# Patient Record
Sex: Male | Born: 1985
Health system: Southern US, Community
[De-identification: ages and names within clinical notes are randomized; demographics above are authoritative.]

## PROBLEM LIST (undated history)

## (undated) DIAGNOSIS — E039 Hypothyroidism, unspecified: Secondary | ICD-10-CM

## (undated) DIAGNOSIS — F419 Anxiety disorder, unspecified: Secondary | ICD-10-CM

## (undated) DIAGNOSIS — I10 Essential (primary) hypertension: Secondary | ICD-10-CM

## (undated) HISTORY — DX: Anxiety disorder, unspecified: F41.9

## (undated) HISTORY — DX: Hypothyroidism, unspecified: E03.9

## (undated) HISTORY — DX: Essential (primary) hypertension: I10

---

## 2019-12-10 DIAGNOSIS — Z20822 Contact with and (suspected) exposure to covid-19: Secondary | ICD-10-CM | POA: Diagnosis not present

## 2019-12-20 DIAGNOSIS — R030 Elevated blood-pressure reading, without diagnosis of hypertension: Secondary | ICD-10-CM | POA: Diagnosis not present

## 2019-12-20 DIAGNOSIS — F419 Anxiety disorder, unspecified: Secondary | ICD-10-CM | POA: Diagnosis not present

## 2019-12-31 ENCOUNTER — Ambulatory Visit (INDEPENDENT_AMBULATORY_CARE_PROVIDER_SITE_OTHER): Payer: BC Managed Care – PPO | Admitting: Family Medicine

## 2019-12-31 ENCOUNTER — Encounter: Payer: Self-pay | Admitting: Family Medicine

## 2019-12-31 ENCOUNTER — Other Ambulatory Visit: Payer: Self-pay

## 2019-12-31 VITALS — BP 134/84 | HR 69 | Temp 98.5°F | Ht 75.0 in | Wt 226.1 lb

## 2019-12-31 DIAGNOSIS — R002 Palpitations: Secondary | ICD-10-CM

## 2019-12-31 DIAGNOSIS — Z8616 Personal history of COVID-19: Secondary | ICD-10-CM | POA: Diagnosis not present

## 2019-12-31 DIAGNOSIS — Z23 Encounter for immunization: Secondary | ICD-10-CM

## 2019-12-31 DIAGNOSIS — R079 Chest pain, unspecified: Secondary | ICD-10-CM | POA: Diagnosis not present

## 2019-12-31 DIAGNOSIS — F411 Generalized anxiety disorder: Secondary | ICD-10-CM

## 2019-12-31 MED ORDER — ALPRAZOLAM 0.5 MG PO TABS: 0.5000 mg | ORAL_TABLET | Freq: Two times a day (BID) | ORAL | 1 refills | Status: DC | PRN

## 2019-12-31 NOTE — Patient Instructions (Signed)
Nice to meet you today! Have labs completed, we'll be in touch with results.  If symptoms persist we may need to do a monitor to check for abnormal heart rhythms.

## 2020-01-01 ENCOUNTER — Other Ambulatory Visit: Payer: Self-pay | Admitting: Family Medicine

## 2020-01-01 LAB — D-DIMER, QUANTITATIVE: D-Dimer, Quant: 0.24 mcg/mL FEU (ref <0.50)

## 2020-01-01 MED ORDER — BUSPIRONE HCL 10 MG PO TABS: ORAL_TABLET | ORAL | 3 refills | Status: DC

## 2020-01-02 DIAGNOSIS — R002 Palpitations: Secondary | ICD-10-CM | POA: Insufficient documentation

## 2020-01-02 DIAGNOSIS — F411 Generalized anxiety disorder: Secondary | ICD-10-CM | POA: Insufficient documentation

## 2020-01-02 NOTE — Assessment & Plan Note (Signed)
EKG with NSR Check D-dimer with recent covid and sharp chest pain.  Discussed that palpitations/tachycardia may occur after having COVID, usually improve over time. Reminded to stay well hydrated.  May also be related to anxiety as he did have some palpitations prior to COVID Can consider holter monitor/ECHO is symptoms persist or worsen.

## 2020-01-02 NOTE — Progress Notes (Signed)
Dylan Owens - 34 y.o. male MRN [id number]  Date of birth: 1985-09-20  Subjective Chief Complaint  Patient presents with  . Establish Care    HPI Dylan Owens is a 34 y.o. male here today for initial visit.  He has history of anxiety.   He was diagnosed with COVID last month.  Course was fairly uncomplicated however he has had recurrent episodes of palpitations since that time.  Had some palpitations related to anxiety previously but increased frequency after COVID. He has also had some sharp pain located along the L side of the chest.  He denies chest tightness or radiation of pain. He does not feel shortness of breath.  He feels like anxiety if pretty well controlled and he takes buspar daily with alprazolam as needed.    ROS:  A comprehensive ROS was completed and negative except as noted per HPI  No Known Allergies  Past Medical History:  Diagnosis Date  . Anxiety   . HTN (hypertension)   . Hypothyroidism     History reviewed. No pertinent surgical history.  Social History   Socioeconomic History  . Marital status: Married    Spouse name: Not on file  . Number of children: Not on file  . Years of education: Not on file  . Highest education level: Not on file  Occupational History  . Not on file  Tobacco Use  . Smoking status: Former Smoker    Types: Cigarettes    Quit date: 2020    Years since quitting: 1.6  . Smokeless tobacco: Former Network engineer  . Vaping Use: Never used  Substance and Sexual Activity  . Alcohol use: Yes    Alcohol/week: 2.0 standard drinks    Types: 2 Standard drinks or equivalent per week  . Drug use: Not Currently    Types: Marijuana  . Sexual activity: Yes    Partners: Female    Birth control/protection: None  Other Topics Concern  . Not on file  Social History Narrative  . Not on file   Social Determinants of Health   Financial Resource Strain:   . Difficulty of Paying Living Expenses:   Food Insecurity:   . Worried  About Charity fundraiser in the Last Year:   . Arboriculturist in the Last Year:   Transportation Needs:   . Film/video editor (Medical):   Marland Kitchen Lack of Transportation (Non-Medical):   Physical Activity:   . Days of Exercise per Week:   . Minutes of Exercise per Session:   Stress:   . Feeling of Stress :   Social Connections:   . Frequency of Communication with Friends and Family:   . Frequency of Social Gatherings with Friends and Family:   . Attends Religious Services:   . Active Member of Clubs or Organizations:   . Attends Archivist Meetings:   Marland Kitchen Marital Status:     Family History  Problem Relation Age of Onset  . High blood pressure Mother   . Hypothyroidism Mother   . High blood pressure Father     Health Maintenance  Topic Date Due  . Hepatitis C Screening  Never done  . COVID-19 Vaccine (1) Never done  . HIV Screening  Never done  . INFLUENZA VACCINE  12/16/2019  . TETANUS/TDAP  12/30/2029     ----------------------------------------------------------------------------------------------------------------------------------------------------------------------------------------------------------------- Physical Exam BP 134/84 (BP Location: Left Arm, Patient Position: Sitting, Cuff Size: Normal)   Pulse 69  Temp 98.5 F (36.9 C) (Oral)   Ht 6\' 3"  (1.905 m)   Wt 226 lb 1.9 oz (102.6 kg)   BMI 28.26 kg/m   Physical Exam Constitutional:      Appearance: Normal appearance.  Eyes:     General: No scleral icterus. Cardiovascular:     Rate and Rhythm: Normal rate and regular rhythm.  Pulmonary:     Effort: Pulmonary effort is normal.     Breath sounds: Normal breath sounds.  Musculoskeletal:     Cervical back: Neck supple.  Neurological:     General: No focal deficit present.     Mental Status: He is alert.  Psychiatric:        Mood and Affect: Mood normal.        Behavior: Behavior normal.      ------------------------------------------------------------------------------------------------------------------------------------------------------------------------------------------------------------------- Assessment and Plan  Palpitations EKG with NSR Check D-dimer with recent covid and sharp chest pain.  Discussed that palpitations/tachycardia may occur after having COVID, usually improve over time. Reminded to stay well hydrated.  May also be related to anxiety as he did have some palpitations prior to COVID Can consider holter monitor/ECHO is symptoms persist or worsen.     GAD (generalized anxiety disorder) He will continue buspar daily with alprazolam as needed.    Meds ordered this encounter  Medications  . ALPRAZolam (XANAX) 0.5 MG tablet    Sig: Take 1 tablet (0.5 mg total) by mouth 2 (two) times daily as needed for anxiety.    Dispense:  30 tablet    Refill:  1    No follow-ups on file.    This visit occurred during the SARS-CoV-2 public health emergency.  Safety protocols were in place, including screening questions prior to the visit, additional usage of staff PPE, and extensive cleaning of exam room while observing appropriate contact time as indicated for disinfecting solutions.

## 2020-01-02 NOTE — Assessment & Plan Note (Signed)
He will continue buspar daily with alprazolam as needed.

## 2020-01-05 DIAGNOSIS — R030 Elevated blood-pressure reading, without diagnosis of hypertension: Secondary | ICD-10-CM | POA: Diagnosis not present

## 2020-01-05 DIAGNOSIS — F411 Generalized anxiety disorder: Secondary | ICD-10-CM | POA: Diagnosis not present

## 2020-01-08 ENCOUNTER — Ambulatory Visit: Payer: Self-pay | Attending: Internal Medicine

## 2020-01-08 ENCOUNTER — Ambulatory Visit: Payer: Self-pay

## 2020-01-08 DIAGNOSIS — Z23 Encounter for immunization: Secondary | ICD-10-CM

## 2020-01-08 NOTE — Progress Notes (Signed)
   Covid-19 Vaccination Clinic  Name:  Dylan Owens    MRN: [id number] DOB: [birth year]  01/08/2020  [name] was observed post Covid-19 immunization for 15 minutes without incident. He was provided with Vaccine Information Sheet and instruction to access the V-Safe system.   [name] was instructed to call 911 with any severe reactions post vaccine: Marland Kitchen Difficulty breathing  . Swelling of face and throat  . A fast heartbeat  . A bad rash all over body  . Dizziness and weakness   Immunizations Administered    Name Date Dose VIS Date Route   Pfizer COVID-19 Vaccine 01/08/2020  1:59 PM 0.3 mL 07/11/2018 Intramuscular   Manufacturer: [place]   Lot: [id number]   [place]: [id number]

## 2020-01-20 ENCOUNTER — Encounter: Payer: Self-pay | Admitting: Family Medicine

## 2020-01-22 ENCOUNTER — Telehealth: Payer: Self-pay | Admitting: Family Medicine

## 2020-01-22 NOTE — Telephone Encounter (Signed)
Error, taken care of.

## 2020-01-22 NOTE — Telephone Encounter (Signed)
I dont see documentation of elbow issue.   If sports med related issue, see Dr Darene Lamer

## 2020-01-22 NOTE — Telephone Encounter (Signed)
Patient was seen on 08/02/19 for this, also per mother.

## 2020-01-22 NOTE — Telephone Encounter (Signed)
Mother wanted to know if the issue for his elbow should be addressed with PCP or Dr.T. States that its his inner elbow now. Please advise.

## 2020-01-24 ENCOUNTER — Ambulatory Visit (INDEPENDENT_AMBULATORY_CARE_PROVIDER_SITE_OTHER): Payer: BC Managed Care – PPO | Admitting: Family Medicine

## 2020-01-24 ENCOUNTER — Encounter: Payer: Self-pay | Admitting: Family Medicine

## 2020-01-24 DIAGNOSIS — F411 Generalized anxiety disorder: Secondary | ICD-10-CM

## 2020-01-24 MED ORDER — BUSPIRONE HCL 15 MG PO TABS: ORAL_TABLET | ORAL | 1 refills | Status: DC

## 2020-01-24 NOTE — Patient Instructions (Signed)
Great to see you! Increase buspar to 15mg  twice per day. You may increased to three times per day if needed.  See me again in in about 4-6 weeks.

## 2020-01-24 NOTE — Assessment & Plan Note (Signed)
Increasing buspar to 15mg  BID He will continue alprazolam as needed.  Return in about 4 weeks (around 02/21/2020) for Anxiety.

## 2020-01-24 NOTE — Progress Notes (Signed)
Dylan Owens - 34 y.o. male MRN [id number]  Date of birth: Dec 08, 1985  Subjective No chief complaint on file.   HPI Dylan Owens is a 34 y.o. male here today for follow up of anxiety.   He has been taking buspar 10mg  BID with alprazolam as needed. He continues to have episodes of chest tightness.  Some improvement with buspar but feels like he needs to increase dose.  No side effects noted at current dose.    Depression screen [facility] 2/9 01/24/2020 12/31/2019  Decreased Interest 0 0  Down, Depressed, Hopeless 0 0  PHQ - 2 Score 0 0  Altered sleeping - 3  Tired, decreased energy - 2  Change in appetite - 2  Feeling bad or failure about yourself  - 0  Trouble concentrating - 1  Moving slowly or fidgety/restless - 0  Suicidal thoughts - 0  PHQ-9 Score - 8  Difficult doing work/chores - Somewhat difficult   GAD 7 : Generalized Anxiety Score 01/24/2020 12/31/2019  Nervous, Anxious, on Edge 1 2  Control/stop worrying 2 2  Worry too much - different things 2 2  Trouble relaxing 2 2  Restless 0 0  Easily annoyed or irritable 2 2  Afraid - awful might happen 1 2  Total GAD 7 Score 10 12  Anxiety Difficulty Somewhat difficult Somewhat difficult   ROS:  A comprehensive ROS was completed and negative except as noted per HPI   No Known Allergies  Past Medical History:  Diagnosis Date  . Anxiety   . HTN (hypertension)   . Hypothyroidism     No past surgical history on file.  Social History   Socioeconomic History  . Marital status: Married    Spouse name: Not on file  . Number of children: Not on file  . Years of education: Not on file  . Highest education level: Not on file  Occupational History  . Not on file  Tobacco Use  . Smoking status: Former Smoker    Types: Cigarettes    Quit date: 2020    Years since quitting: 1.6  . Smokeless tobacco: Former Network engineer  . Vaping Use: Never used  Substance and Sexual Activity  . Alcohol use: Yes    Alcohol/week: 2.0  standard drinks    Types: 2 Standard drinks or equivalent per week  . Drug use: Not Currently    Types: Marijuana  . Sexual activity: Yes    Partners: Female    Birth control/protection: None  Other Topics Concern  . Not on file  Social History Narrative  . Not on file   Social Determinants of Health   Financial Resource Strain:   . Difficulty of Paying Living Expenses: Not on file  Food Insecurity:   . Worried About Charity fundraiser in the Last Year: Not on file  . Ran Out of Food in the Last Year: Not on file  Transportation Needs:   . Lack of Transportation (Medical): Not on file  . Lack of Transportation (Non-Medical): Not on file  Physical Activity:   . Days of Exercise per Week: Not on file  . Minutes of Exercise per Session: Not on file  Stress:   . Feeling of Stress : Not on file  Social Connections:   . Frequency of Communication with Friends and Family: Not on file  . Frequency of Social Gatherings with Friends and Family: Not on file  . Attends Religious Services: Not on file  .  Active Member of Clubs or Organizations: Not on file  . Attends Archivist Meetings: Not on file  . Marital Status: Not on file    Family History  Problem Relation Age of Onset  . High blood pressure Mother   . Hypothyroidism Mother   . High blood pressure Father     Health Maintenance  Topic Date Due  . Hepatitis C Screening  Never done  . HIV Screening  Never done  . INFLUENZA VACCINE  Never done  . COVID-19 Vaccine (2 - Pfizer 2-dose series) 01/29/2020  . TETANUS/TDAP  12/30/2029     ----------------------------------------------------------------------------------------------------------------------------------------------------------------------------------------------------------------- Physical Exam BP 120/89 (BP Location: Left Arm, Patient Position: Sitting, Cuff Size: Normal)   Pulse 65   Temp 98.1 F (36.7 C)   Ht 6\' 3"  (1.905 m)   Wt 226 lb 4.8 oz  (102.6 kg)   SpO2 100%   BMI 28.29 kg/m   Physical Exam Constitutional:      Appearance: Normal appearance.  Neurological:     General: No focal deficit present.     Mental Status: He is alert.  Psychiatric:        Mood and Affect: Mood normal.        Behavior: Behavior normal.     ------------------------------------------------------------------------------------------------------------------------------------------------------------------------------------------------------------------- Assessment and Plan  GAD (generalized anxiety disorder) Increasing buspar to 15mg  BID He will continue alprazolam as needed.  Return in about 4 weeks (around 02/21/2020) for Anxiety.    Meds ordered this encounter  Medications  . busPIRone (BUSPAR) 15 MG tablet    Sig: Take 1 tab BID.  May increase to TID if needed.    Dispense:  60 tablet    Refill:  1    No follow-ups on file.    This visit occurred during the SARS-CoV-2 public health emergency.  Safety protocols were in place, including screening questions prior to the visit, additional usage of staff PPE, and extensive cleaning of exam room while observing appropriate contact time as indicated for disinfecting solutions.

## 2020-02-05 ENCOUNTER — Ambulatory Visit: Payer: BC Managed Care – PPO | Attending: Internal Medicine

## 2020-02-05 ENCOUNTER — Encounter: Payer: Self-pay | Admitting: Family Medicine

## 2020-02-05 ENCOUNTER — Ambulatory Visit: Payer: BC Managed Care – PPO

## 2020-02-05 DIAGNOSIS — Z23 Encounter for immunization: Secondary | ICD-10-CM

## 2020-02-05 NOTE — Progress Notes (Signed)
   Covid-19 Vaccination Clinic  Name:  Dylan Owens    MRN: [id number] DOB: Jan 04, 1986  02/05/2020  Mr. [name] was observed post Covid-19 immunization for 15 minutes without incident. He was provided with Vaccine Information Sheet and instruction to access the V-Safe system.   Mr. [name] was instructed to call 911 with any severe reactions post vaccine: Marland Kitchen Difficulty breathing  . Swelling of face and throat  . A fast heartbeat  . A bad rash all over body  . Dizziness and weakness   Immunizations Administered    Name Date Dose VIS Date Route   Pfizer COVID-19 Vaccine 02/05/2020 10:38 AM 0.3 mL 07/11/2018 Intramuscular   Manufacturer: [place]   Lot: [id number]   [place]: [id number]

## 2020-02-21 ENCOUNTER — Encounter: Payer: Self-pay | Admitting: Family Medicine

## 2020-02-21 ENCOUNTER — Ambulatory Visit (INDEPENDENT_AMBULATORY_CARE_PROVIDER_SITE_OTHER): Payer: BC Managed Care – PPO | Admitting: Family Medicine

## 2020-02-21 DIAGNOSIS — F411 Generalized anxiety disorder: Secondary | ICD-10-CM | POA: Diagnosis not present

## 2020-02-21 DIAGNOSIS — R079 Chest pain, unspecified: Secondary | ICD-10-CM

## 2020-02-21 NOTE — Assessment & Plan Note (Signed)
Chest pain is atypical and I still favor anxiety as the cause of his symptoms.  If this persists will plan to obtain CXR.

## 2020-02-21 NOTE — Assessment & Plan Note (Signed)
Improved with current dose of buspar and alprazolam as needed.  Will continue at current strength and schedule for now.

## 2020-02-21 NOTE — Progress Notes (Signed)
Dylan Owens - 34 y.o. male MRN [id number]  Date of birth: 10-26-1985  Subjective No chief complaint on file.   HPI Dylan Owens is a 34 y.o. male here today for follow up of anxiety.  Anxiety is currently managed with buspar daily and alprazolam as needed.  He did go ahead and increase his buspar to 15mg  TID and feels that this is working much better.  He is using alprazolam occasionally.    He does continue to have episodic chest pain.  Previous EKG normal.  Pain varies sometimes sharp and sometimes dull.  He had some mild improvement with PPI.  Denies dyspnea, nausea or vomiting.    Depression screen [facility] 2/9 02/21/2020 01/24/2020 12/31/2019  Decreased Interest 1 0 0  Down, Depressed, Hopeless 1 0 0  PHQ - 2 Score 2 0 0  Altered sleeping 2 - 3  Tired, decreased energy 1 - 2  Change in appetite 0 - 2  Feeling bad or failure about yourself  0 - 0  Trouble concentrating 0 - 1  Moving slowly or fidgety/restless 0 - 0  Suicidal thoughts 0 - 0  PHQ-9 Score 5 - 8  Difficult doing work/chores Not difficult at all - Somewhat difficult   GAD 7 : Generalized Anxiety Score 02/21/2020 01/24/2020 12/31/2019  Nervous, Anxious, on Edge 2 1 2   Control/stop worrying 1 2 2   Worry too much - different things 1 2 2   Trouble relaxing 1 2 2   Restless 0 0 0  Easily annoyed or irritable 1 2 2   Afraid - awful might happen 1 1 2   Total GAD 7 Score 7 10 12   Anxiety Difficulty Somewhat difficult Somewhat difficult Somewhat difficult      ROS:  A comprehensive ROS was completed and negative except as noted per HPI  No Known Allergies  Past Medical History:  Diagnosis Date  . Anxiety   . HTN (hypertension)   . Hypothyroidism     No past surgical history on file.  Social History   Socioeconomic History  . Marital status: Married    Spouse name: Not on file  . Number of children: Not on file  . Years of education: Not on file  . Highest education level: Not on file  Occupational History  .  Not on file  Tobacco Use  . Smoking status: Former Smoker    Types: Cigarettes    Quit date: 2020    Years since quitting: 1.7  . Smokeless tobacco: Former Network engineer  . Vaping Use: Never used  Substance and Sexual Activity  . Alcohol use: Yes    Alcohol/week: 2.0 standard drinks    Types: 2 Standard drinks or equivalent per week  . Drug use: Not Currently    Types: Marijuana  . Sexual activity: Yes    Partners: Female    Birth control/protection: None  Other Topics Concern  . Not on file  Social History Narrative  . Not on file   Social Determinants of Health   Financial Resource Strain:   . Difficulty of Paying Living Expenses: Not on file  Food Insecurity:   . Worried About Charity fundraiser in the Last Year: Not on file  . Ran Out of Food in the Last Year: Not on file  Transportation Needs:   . Lack of Transportation (Medical): Not on file  . Lack of Transportation (Non-Medical): Not on file  Physical Activity:   . Days of Exercise per Week:  Not on file  . Minutes of Exercise per Session: Not on file  Stress:   . Feeling of Stress : Not on file  Social Connections:   . Frequency of Communication with Friends and Family: Not on file  . Frequency of Social Gatherings with Friends and Family: Not on file  . Attends Religious Services: Not on file  . Active Member of Clubs or Organizations: Not on file  . Attends Archivist Meetings: Not on file  . Marital Status: Not on file    Family History  Problem Relation Age of Onset  . High blood pressure Mother   . Hypothyroidism Mother   . High blood pressure Father     Health Maintenance  Topic Date Due  . Hepatitis C Screening  Never done  . HIV Screening  Never done  . INFLUENZA VACCINE  Never done  . TETANUS/TDAP  12/30/2029  . COVID-19 Vaccine  Completed      ----------------------------------------------------------------------------------------------------------------------------------------------------------------------------------------------------------------- Physical Exam BP 127/83 (BP Location: Left Arm, Patient Position: Sitting, Cuff Size: Large)   Pulse 100   Temp 97.9 F (36.6 C)   Wt 234 lb 6.4 oz (106.3 kg)   SpO2 100%   BMI 29.30 kg/m   Physical Exam Constitutional:      Appearance: Normal appearance.  Cardiovascular:     Rate and Rhythm: Normal rate and regular rhythm.  Pulmonary:     Effort: Pulmonary effort is normal.     Breath sounds: Normal breath sounds.  Musculoskeletal:     Cervical back: Neck supple.  Neurological:     Mental Status: He is alert.     ------------------------------------------------------------------------------------------------------------------------------------------------------------------------------------------------------------------- Assessment and Plan  Chest pain Chest pain is atypical and I still favor anxiety as the cause of his symptoms.  If this persists will plan to obtain CXR.   GAD (generalized anxiety disorder) Improved with current dose of buspar and alprazolam as needed.  Will continue at current strength and schedule for now.     No orders of the defined types were placed in this encounter.   No follow-ups on file.    This visit occurred during the SARS-CoV-2 public health emergency.  Safety protocols were in place, including screening questions prior to the visit, additional usage of staff PPE, and extensive cleaning of exam room while observing appropriate contact time as indicated for disinfecting solutions.

## 2020-03-13 ENCOUNTER — Encounter: Payer: Self-pay | Admitting: Family Medicine

## 2020-03-14 ENCOUNTER — Other Ambulatory Visit: Payer: Self-pay | Admitting: Family Medicine

## 2020-03-14 DIAGNOSIS — R0789 Other chest pain: Secondary | ICD-10-CM

## 2020-03-18 ENCOUNTER — Encounter: Payer: Self-pay | Admitting: Family Medicine

## 2020-03-19 ENCOUNTER — Other Ambulatory Visit: Payer: Self-pay

## 2020-03-19 ENCOUNTER — Ambulatory Visit (INDEPENDENT_AMBULATORY_CARE_PROVIDER_SITE_OTHER): Payer: BC Managed Care – PPO

## 2020-03-19 DIAGNOSIS — R079 Chest pain, unspecified: Secondary | ICD-10-CM | POA: Diagnosis not present

## 2020-03-19 DIAGNOSIS — R0789 Other chest pain: Secondary | ICD-10-CM | POA: Diagnosis not present

## 2020-03-20 ENCOUNTER — Encounter: Payer: Self-pay | Admitting: Family Medicine

## 2020-03-20 ENCOUNTER — Ambulatory Visit (INDEPENDENT_AMBULATORY_CARE_PROVIDER_SITE_OTHER): Payer: BC Managed Care – PPO | Admitting: Family Medicine

## 2020-03-20 VITALS — BP 148/92 | HR 70 | Temp 97.9°F | Wt 240.1 lb

## 2020-03-20 DIAGNOSIS — K602 Anal fissure, unspecified: Secondary | ICD-10-CM | POA: Diagnosis not present

## 2020-03-20 DIAGNOSIS — Z23 Encounter for immunization: Secondary | ICD-10-CM

## 2020-03-20 DIAGNOSIS — F410 Panic disorder [episodic paroxysmal anxiety] without agoraphobia: Secondary | ICD-10-CM | POA: Diagnosis not present

## 2020-03-20 MED ORDER — NIFEDIPINE POWD: 1 refills | Status: DC

## 2020-03-20 NOTE — Patient Instructions (Signed)
Increase fiber and fluid intake (benefiber, metamucil)     Anal Fissure, Adult  An anal fissure is a small tear or crack in the tissue of the anus. Bleeding from a fissure usually stops on its own within a few minutes. However, bleeding will often occur again with each bowel movement until the fissure heals. What are the causes? This condition is usually caused by passing a large or hard stool (feces). Other causes include:  Constipation.  Frequent diarrhea.  Inflammatory bowel disease (Crohn's disease or ulcerative colitis).  Childbirth.  Infections.  Anal sex. What are the signs or symptoms? Symptoms of this condition include:  Bleeding from the rectum.  Small amounts of blood seen on your stool, on the toilet paper, or in the toilet after a bowel movement. The blood coats the outside of the stool and is not mixed with the stool.  Painful bowel movements.  Itching or irritation around the anus. How is this diagnosed? A health care provider may diagnose this condition by closely examining the anal area. An anal fissure can usually be seen with careful inspection. In some cases, a rectal exam may be performed, or a short tube (anoscope) may be used to examine the anal canal. How is this treated? Initial treatment for this condition may include:  Taking steps to avoid constipation. This may include making changes to your diet, such as increasing your intake of fiber or fluid.  Taking fiber supplements. These supplements can soften your stool to help make bowel movements easier. Your health care provider may also prescribe a stool softener if your stool is hard.  Taking sitz baths. This may help to heal the tear.  Using medicated creams or ointments. These may be prescribed to lessen discomfort. Treatments that are sometimes used if initial treatments do not work well or if the condition is more severe may include:  Botulinum injection.  Surgery to repair the  fissure. Follow these instructions at home: Eating and drinking   Avoid foods that may cause constipation, such as bananas, milk, and other dairy products.  Eat all fruits, except bananas.  Drink enough fluid to keep your urine pale yellow.  Eat foods that are high in fiber, such as beans, whole grains, and fresh fruits and vegetables. General instructions   Take over-the-counter and prescription medicines only as told by your health care provider.  Use creams or ointments only as told by your health care provider.  Keep the anal area clean and dry.  Take sitz baths as told by your health care provider. Do not use soap in the sitz baths.  Keep all follow-up visits as told by your health care provider. This is important. Contact a health care provider if you have:  More bleeding.  A fever.  Diarrhea that is mixed with blood.  Pain that continues.  Ongoing problems that are getting worse rather than better. Summary  An anal fissure is a small tear or crack in the tissue of the anus. This condition is usually caused by passing a large or hard stool (feces). Other causes include constipation and frequent diarrhea.  Initial treatment for this condition may include taking steps to avoid constipation, such as increasing your intake of fiber or fluid.  Follow instructions for care as told by your health care provider.  Contact your health care provider if you have more bleeding or your problem is getting worse rather than better.  Keep all follow-up visits as told by your health care provider. This  is important. This information is not intended to replace advice given to you by your health care provider. Make sure you discuss any questions you have with your health care provider. Document Revised: 10/13/2017 Document Reviewed: 10/13/2017 Elsevier Patient Education  [place].

## 2020-03-20 NOTE — Assessment & Plan Note (Addendum)
Anal fissure noted.  Recommend increased fiber intake or utilization of stool softener.  Also recommend increased fluid intake.  Will have 0.3% nifedipine gel compounded to apply rectally as needed for comfort and facilitate healing.  Instructed to follow up if having increased bleeding or if not improving.

## 2020-03-20 NOTE — Progress Notes (Signed)
[name] - 34 y.o. male MRN [id number]  Date of birth: Jun 16, 1985  Subjective Chief Complaint  Patient presents with  . Rectal Bleeding    HPI Dylan Owens is a 34 y.o. male here today with complaint of rectal pain and bleeding.  This started a few days ago.  He has blood when wiping as well as some blood on the stool at times.    Pain is sharp, burning sensation with bowel movements.  He has history of hard bowel movements. He has not tried anything for treatment so far.  He admits that his fiber intake is pretty low.    ROS:  A comprehensive ROS was completed and negative except as noted per HPI  No Known Allergies  Past Medical History:  Diagnosis Date  . Anxiety   . HTN (hypertension)   . Hypothyroidism     History reviewed. No pertinent surgical history.  Social History   Socioeconomic History  . Marital status: Married    Spouse name: Not on file  . Number of children: Not on file  . Years of education: Not on file  . Highest education level: Not on file  Occupational History  . Not on file  Tobacco Use  . Smoking status: Former Smoker    Types: Cigarettes    Quit date: 2020    Years since quitting: 1.8  . Smokeless tobacco: Former Network engineer  . Vaping Use: Never used  Substance and Sexual Activity  . Alcohol use: Yes    Alcohol/week: 2.0 standard drinks    Types: 2 Standard drinks or equivalent per week  . Drug use: Not Currently    Types: Marijuana  . Sexual activity: Yes    Partners: Female    Birth control/protection: None  Other Topics Concern  . Not on file  Social History Narrative  . Not on file   Social Determinants of Health   Financial Resource Strain:   . Difficulty of Paying Living Expenses: Not on file  Food Insecurity:   . Worried About Charity fundraiser in the Last Year: Not on file  . Ran Out of Food in the Last Year: Not on file  Transportation Needs:   . Lack of Transportation (Medical): Not on file  . Lack of  Transportation (Non-Medical): Not on file  Physical Activity:   . Days of Exercise per Week: Not on file  . Minutes of Exercise per Session: Not on file  Stress:   . Feeling of Stress : Not on file  Social Connections:   . Frequency of Communication with Friends and Family: Not on file  . Frequency of Social Gatherings with Friends and Family: Not on file  . Attends Religious Services: Not on file  . Active Member of Clubs or Organizations: Not on file  . Attends Archivist Meetings: Not on file  . Marital Status: Not on file    Family History  Problem Relation Age of Onset  . High blood pressure Mother   . Hypothyroidism Mother   . High blood pressure Father     Health Maintenance  Topic Date Due  . Hepatitis C Screening  Never done  . HIV Screening  Never done  . TETANUS/TDAP  12/30/2029  . INFLUENZA VACCINE  Completed  . COVID-19 Vaccine  Completed     ----------------------------------------------------------------------------------------------------------------------------------------------------------------------------------------------------------------- Physical Exam BP (!) 148/92 (BP Location: Left Arm, Patient Position: Sitting, Cuff Size: Normal)   Pulse 70  Temp 97.9 F (36.6 C)   Wt 240 lb 1.6 oz (108.9 kg)   SpO2 100%   BMI 30.01 kg/m   Physical Exam Constitutional:      Appearance: Normal appearance.  Genitourinary:    Rectum: Anal fissure present. No external hemorrhoid or internal hemorrhoid.    Neurological:     General: No focal deficit present.     Mental Status: He is alert.  Psychiatric:        Mood and Affect: Mood normal.        Behavior: Behavior normal.     ------------------------------------------------------------------------------------------------------------------------------------------------------------------------------------------------------------------- Assessment and Plan  Anal fissure Anal fissure noted.   Recommend increased fiber intake or utilization of stool softener.  Also recommend increased fluid intake.  Will have 0.3% nifedipine gel compounded to apply rectally as needed for comfort and facilitate healing.  Instructed to follow up if having increased bleeding or if not improving.      Meds ordered this encounter  Medications  . NIFEdipine POWD    Sig: Please compound into a 0.3% gel to apply to external anal area TID for up to 8 weeks.  Please dispense 80g tube with 1 refill.    Dispense:  80 g    Refill:  1    No follow-ups on file.    This visit occurred during the SARS-CoV-2 public health emergency.  Safety protocols were in place, including screening questions prior to the visit, additional usage of staff PPE, and extensive cleaning of exam room while observing appropriate contact time as indicated for disinfecting solutions.

## 2020-04-03 ENCOUNTER — Other Ambulatory Visit: Payer: Self-pay | Admitting: Family Medicine

## 2020-04-07 DIAGNOSIS — F410 Panic disorder [episodic paroxysmal anxiety] without agoraphobia: Secondary | ICD-10-CM | POA: Diagnosis not present

## 2021-04-14 ENCOUNTER — Ambulatory Visit: Payer: BC Managed Care – PPO | Admitting: Sports Medicine

## 2021-04-14 ENCOUNTER — Ambulatory Visit (INDEPENDENT_AMBULATORY_CARE_PROVIDER_SITE_OTHER): Payer: BC Managed Care – PPO

## 2021-04-14 ENCOUNTER — Other Ambulatory Visit: Payer: Self-pay

## 2021-04-14 DIAGNOSIS — M79641 Pain in right hand: Secondary | ICD-10-CM | POA: Diagnosis not present

## 2021-04-14 DIAGNOSIS — M25531 Pain in right wrist: Secondary | ICD-10-CM

## 2021-04-14 NOTE — Progress Notes (Signed)
    Procedures performed today:    None.  Independent interpretation of notes and tests performed by another provider:   None.  Brief History, Exam, Impression, and Recommendations:    Right wrist pain This is a pleasant 35 year old male, he had an episode of micturition syncope, fell with his wrist in an outstretched position, since then he has had some pain, minimal swelling dorsal radiocarpal joint as well as second MCP. Exam is for the most part normal with the exception of some pain with terminal flexion and extension, as well as tenderness at the second MCP volar aspect. Adding x-rays of the hand and wrist, Velcro brace, he can use over-the-counter analgesics, he will discuss his several episodes of micturition syncope with his PCP.    ___________________________________________ Gwen Her. Dianah Field, M.D., ABFM., CAQSM. Primary Care and [place] Instructor of [place] [facility]

## 2021-04-14 NOTE — Assessment & Plan Note (Signed)
This is a pleasant 35 year old male, he had an episode of micturition syncope, fell with his wrist in an outstretched position, since then he has had some pain, minimal swelling dorsal radiocarpal joint as well as second MCP. Exam is for the most part normal with the exception of some pain with terminal flexion and extension, as well as tenderness at the second MCP volar aspect. Adding x-rays of the hand and wrist, Velcro brace, he can use over-the-counter analgesics, he will discuss his several episodes of micturition syncope with his PCP.

## 2021-08-31 ENCOUNTER — Telehealth: Payer: BC Managed Care – PPO | Admitting: Nurse Practitioner

## 2021-08-31 DIAGNOSIS — L089 Local infection of the skin and subcutaneous tissue, unspecified: Secondary | ICD-10-CM

## 2021-08-31 MED ORDER — SULFAMETHOXAZOLE-TRIMETHOPRIM 800-160 MG PO TABS: 1.0000 | ORAL_TABLET | Freq: Two times a day (BID) | ORAL | 0 refills | Status: AC

## 2021-08-31 MED ORDER — MUPIROCIN 2 % EX OINT: 1.0000 "application " | TOPICAL_OINTMENT | Freq: Two times a day (BID) | CUTANEOUS | 0 refills | Status: AC

## 2021-08-31 NOTE — Progress Notes (Signed)
?Virtual Visit Consent  ? ?[name], you are scheduled for a virtual visit with a [facility] provider today.   ?  ?Just as with appointments in the office, your consent must be obtained to participate.  Your consent will be active for this visit and any virtual visit you may have with one of our providers in the next 365 days.   ?  ?If you have a MyChart account, a copy of this consent can be sent to you electronically.  All virtual visits are billed to your insurance company just like a traditional visit in the office.   ? ?As this is a virtual visit, video technology does not allow for your provider to perform a traditional examination.  This may limit your provider's ability to fully assess your condition.  If your provider identifies any concerns that need to be evaluated in person or the need to arrange testing (such as labs, EKG, etc.), we will make arrangements to do so.   ?  ?Although advances in technology are sophisticated, we cannot ensure that it will always work on either your end or our end.  If the connection with a video visit is poor, the visit may have to be switched to a telephone visit.  With either a video or telephone visit, we are not always able to ensure that we have a secure connection.    ? ?I need to obtain your verbal consent now.   Are you willing to proceed with your visit today?  ?  ?Dylan Owens has provided verbal consent on 08/31/2021 for a virtual visit (video or telephone). ?  ?[name], FNP  ? ?Date: 08/31/2021 9:40 AM ? ? ?Virtual Visit via Video Note  ? ?IApolonio Schneiders, connected with  Dylan Owens  ([id number], 18-Mar-1986) on 08/31/21 at  9:45 AM EDT by a video-enabled telemedicine application and verified that I am speaking with the correct person using two identifiers. ? ?Location: ?Patient: Virtual Visit Location Patient: Home ?Provider: Virtual Visit Location Provider: Home Office ?  ?I discussed the limitations of evaluation and management by telemedicine  and the availability of in person appointments. The patient expressed understanding and agreed to proceed.   ? ?History of Present Illness: ?Dylan Owens is a 36 y.o. who identifies as a male who was assigned male at birth, and is being seen today with complaints of an infection to his nose that started 4 days ago. He started using topical antibiotic ointment on the area and it has since scabbed over but he is having some swelling to his right face and itching now.  ?Denies any known trauma or bites.  ?Denies known sick contacts  ? ?Denies fever  ?Thought initially he had swollen lymph nodes in his cervical region that have since resolved  ?Denies other systemic symptoms  ?He has taken Naproxen for relief  ? ?The area to his nose is now itchy.  ?Denies a history of MRSA  ?He has had surgery for appendectomy and wisdom teeth removal without any complication.  ? ?Problems:  ?Patient Active Problem List  ? Diagnosis Date Noted  ? Right wrist pain 04/14/2021  ? Anal fissure 03/20/2020  ? Chest pain 02/21/2020  ? Palpitations 01/02/2020  ? GAD (generalized anxiety disorder) 01/02/2020  ?  ?Allergies: No Known Allergies ?Medications:  ?Current Outpatient Medications:  ?  ALPRAZolam (XANAX) 0.5 MG tablet, Take 1 tablet (0.5 mg total) by mouth 2 (two) times daily as needed for anxiety.,  Disp: 30 tablet, Rfl: 1 ?  busPIRone (BUSPAR) 15 MG tablet, TAKE 1 TABLET BY MOUTH TWICE A DAY, MAY INCREASE TO 3 TIMES A DAY IF NEEDED., Disp: 60 tablet, Rfl: 1 ?  NIFEdipine POWD, Please compound into a 0.3% gel to apply to external anal area TID for up to 8 weeks.  Please dispense 80g tube with 1 refill., Disp: 80 g, Rfl: 1 ? ?Observations/Objective: ?Patient is well-developed, well-nourished in no acute distress.  ?Resting comfortably  at home.  ?Head is normocephalic, atraumatic.  ?No labored breathing.  ?Speech is clear and coherent with logical content.  ?Patient is alert and oriented at baseline.  ?Scabbed area to right bridge of  nose with erythema surrounding and slight edema to right face under eye.  ?No involvement of eye no drainage  ? ?Assessment and Plan: ?1. Skin infection ? ?- mupirocin ointment (BACTROBAN) 2 %; Apply 1 application. topically 2 (two) times daily for 7 days.  Dispense: 22 g; Refill: 0 ?- sulfamethoxazole-trimethoprim (BACTRIM DS) 800-160 MG tablet; Take 1 tablet by mouth 2 (two) times daily for 10 days.  Dispense: 20 tablet; Refill: 0 ?   ? ?Follow Up Instructions: ?I discussed the assessment and treatment plan with the patient. The patient was provided an opportunity to ask questions and all were answered. The patient agreed with the plan and demonstrated an understanding of the instructions.  A copy of instructions were sent to the patient via MyChart unless otherwise noted below.  ? ? ?The patient was advised to call back or seek an in-person evaluation if the symptoms worsen or if the condition fails to improve as anticipated. ? ?Time:  ?I spent 15 minutes with the patient via telehealth technology discussing the above problems/concerns.   ? ?[name], FNP  ?

## 2021-09-01 ENCOUNTER — Encounter: Payer: Self-pay | Admitting: Family Medicine

## 2021-09-01 ENCOUNTER — Ambulatory Visit (INDEPENDENT_AMBULATORY_CARE_PROVIDER_SITE_OTHER): Payer: Self-pay | Admitting: Family Medicine

## 2021-09-01 DIAGNOSIS — J340 Abscess, furuncle and carbuncle of nose: Secondary | ICD-10-CM

## 2021-09-01 NOTE — Patient Instructions (Signed)
Continue antibiotic.  ?Let me know if symptoms worsen.   ?

## 2021-09-01 NOTE — Progress Notes (Signed)
?Dylan Owens - 36 y.o. male MRN [id number]  Date of birth: 12-07-1985 ? ?Subjective ?No chief complaint on file. ? ? ?HPI ?Dylan Owens is a 36 year old male here today with complaint of skin infection.  He noted a couple of bumps on the bridge of his nose a few days ago which became more reddened and painful.  Area became more ulcerated and spread to right cheek area.  He did have a video visit yesterday, started on Bactrim as well as mupirocin ointment.  Starting this he has noted some improvement.  Still has a little swelling under the eye.  He denies eye pain, fever or chills.  He does have what seems to be an enlarged lymph node under his right mandible area. ? ?ROS:  A comprehensive ROS was completed and negative except as noted per HPI ? ?No Known Allergies ? ?Past Medical History:  ?Diagnosis Date  ? Anxiety   ? HTN (hypertension)   ? Hypothyroidism   ? ? ?History reviewed. No pertinent surgical history. ? ?Social History  ? ?Socioeconomic History  ? Marital status: Married  ?  Spouse name: Not on file  ? Number of children: Not on file  ? Years of education: Not on file  ? Highest education level: Not on file  ?Occupational History  ? Not on file  ?Tobacco Use  ? Smoking status: Former  ?  Types: Cigarettes  ?  Quit date: 2020  ?  Years since quitting: 3.2  ? Smokeless tobacco: Former  ?Vaping Use  ? Vaping Use: Never used  ?Substance and Sexual Activity  ? Alcohol use: Yes  ?  Alcohol/week: 2.0 standard drinks  ?  Types: 2 Standard drinks or equivalent per week  ? Drug use: Not Currently  ?  Types: Marijuana  ? Sexual activity: Yes  ?  Partners: Female  ?  Birth control/protection: None  ?Other Topics Concern  ? Not on file  ?Social History Narrative  ? Not on file  ? ?Social Determinants of Health  ? ?Financial Resource Strain: Not on file  ?Food Insecurity: Not on file  ?Transportation Needs: Not on file  ?Physical Activity: Not on file  ?Stress: Not on file  ?Social Connections: Not on file  ? ? ?Family  History  ?Problem Relation Age of Onset  ? High blood pressure Mother   ? Hypothyroidism Mother   ? High blood pressure Father   ? ? ?Health Maintenance  ?Topic Date Due  ? COVID-19 Vaccine (3 - Booster for Pfizer series) 09/17/2021 (Originally 04/01/2020)  ? Hepatitis C Screening  09/02/2022 (Originally 05/14/2004)  ? HIV Screening  09/02/2022 (Originally 05/14/2001)  ? INFLUENZA VACCINE  12/15/2021  ? TETANUS/TDAP  12/30/2029  ? HPV VACCINES  Aged Out  ? ? ? ?----------------------------------------------------------------------------------------------------------------------------------------------------------------------------------------------------------------- ?Physical Exam ?BP (!) 137/94 (BP Location: Left Arm, Patient Position: Sitting, Cuff Size: Normal)   Pulse 91   Ht '6\' 3"'$  (1.905 m)   Wt 259 lb (117.5 kg)   SpO2 99%   BMI 32.37 kg/m?  ? ?Physical Exam ?Constitutional:   ?   Appearance: Normal appearance.  ?Eyes:  ?   General: No scleral icterus. ?Cardiovascular:  ?   Rate and Rhythm: Normal rate and regular rhythm.  ?Pulmonary:  ?   Effort: Pulmonary effort is normal.  ?   Breath sounds: Normal breath sounds.  ?Skin: ?   Comments: Ulcerated and scabbed over area along the right side of nose as well as nasolabial fold.  Some mild  puffiness of the right cheek without erythema.  Enlarged submandibular lymph node, likely reactive.  ?Neurological:  ?   General: No focal deficit present.  ?   Mental Status: He is alert.  ?Psychiatric:     ?   Mood and Affect: Mood normal.     ?   Behavior: Behavior normal.  ? ? ?------------------------------------------------------------------------------------------------------------------------------------------------------------------------------------------------------------------- ?Assessment and Plan ? ?Cellulitis of nose ?He has had improvement since adding Bactrim and mupirocin.  Recommend that he continue to monitor for any worsening and complete course of  antibiotics.  Instructed to contact clinic if having worsening symptoms. ? ? ?No orders of the defined types were placed in this encounter. ? ? ?No follow-ups on file. ? ? ? ?This visit occurred during the SARS-CoV-2 public health emergency.  Safety protocols were in place, including screening questions prior to the visit, additional usage of staff PPE, and extensive cleaning of exam room while observing appropriate contact time as indicated for disinfecting solutions.  ? ?

## 2021-09-01 NOTE — Assessment & Plan Note (Signed)
He has had improvement since adding Bactrim and mupirocin.  Recommend that he continue to monitor for any worsening and complete course of antibiotics.  Instructed to contact clinic if having worsening symptoms. ?

## 2021-10-01 IMAGING — DX DG CHEST 2V
2 series · 2 of 2 positions shown · non-contrast
Comparison: None.

CLINICAL DATA: 33-year-old male with intermittent chest pain.
Former smoker.

EXAM:
CHEST - 2 VIEW

[chest pa]
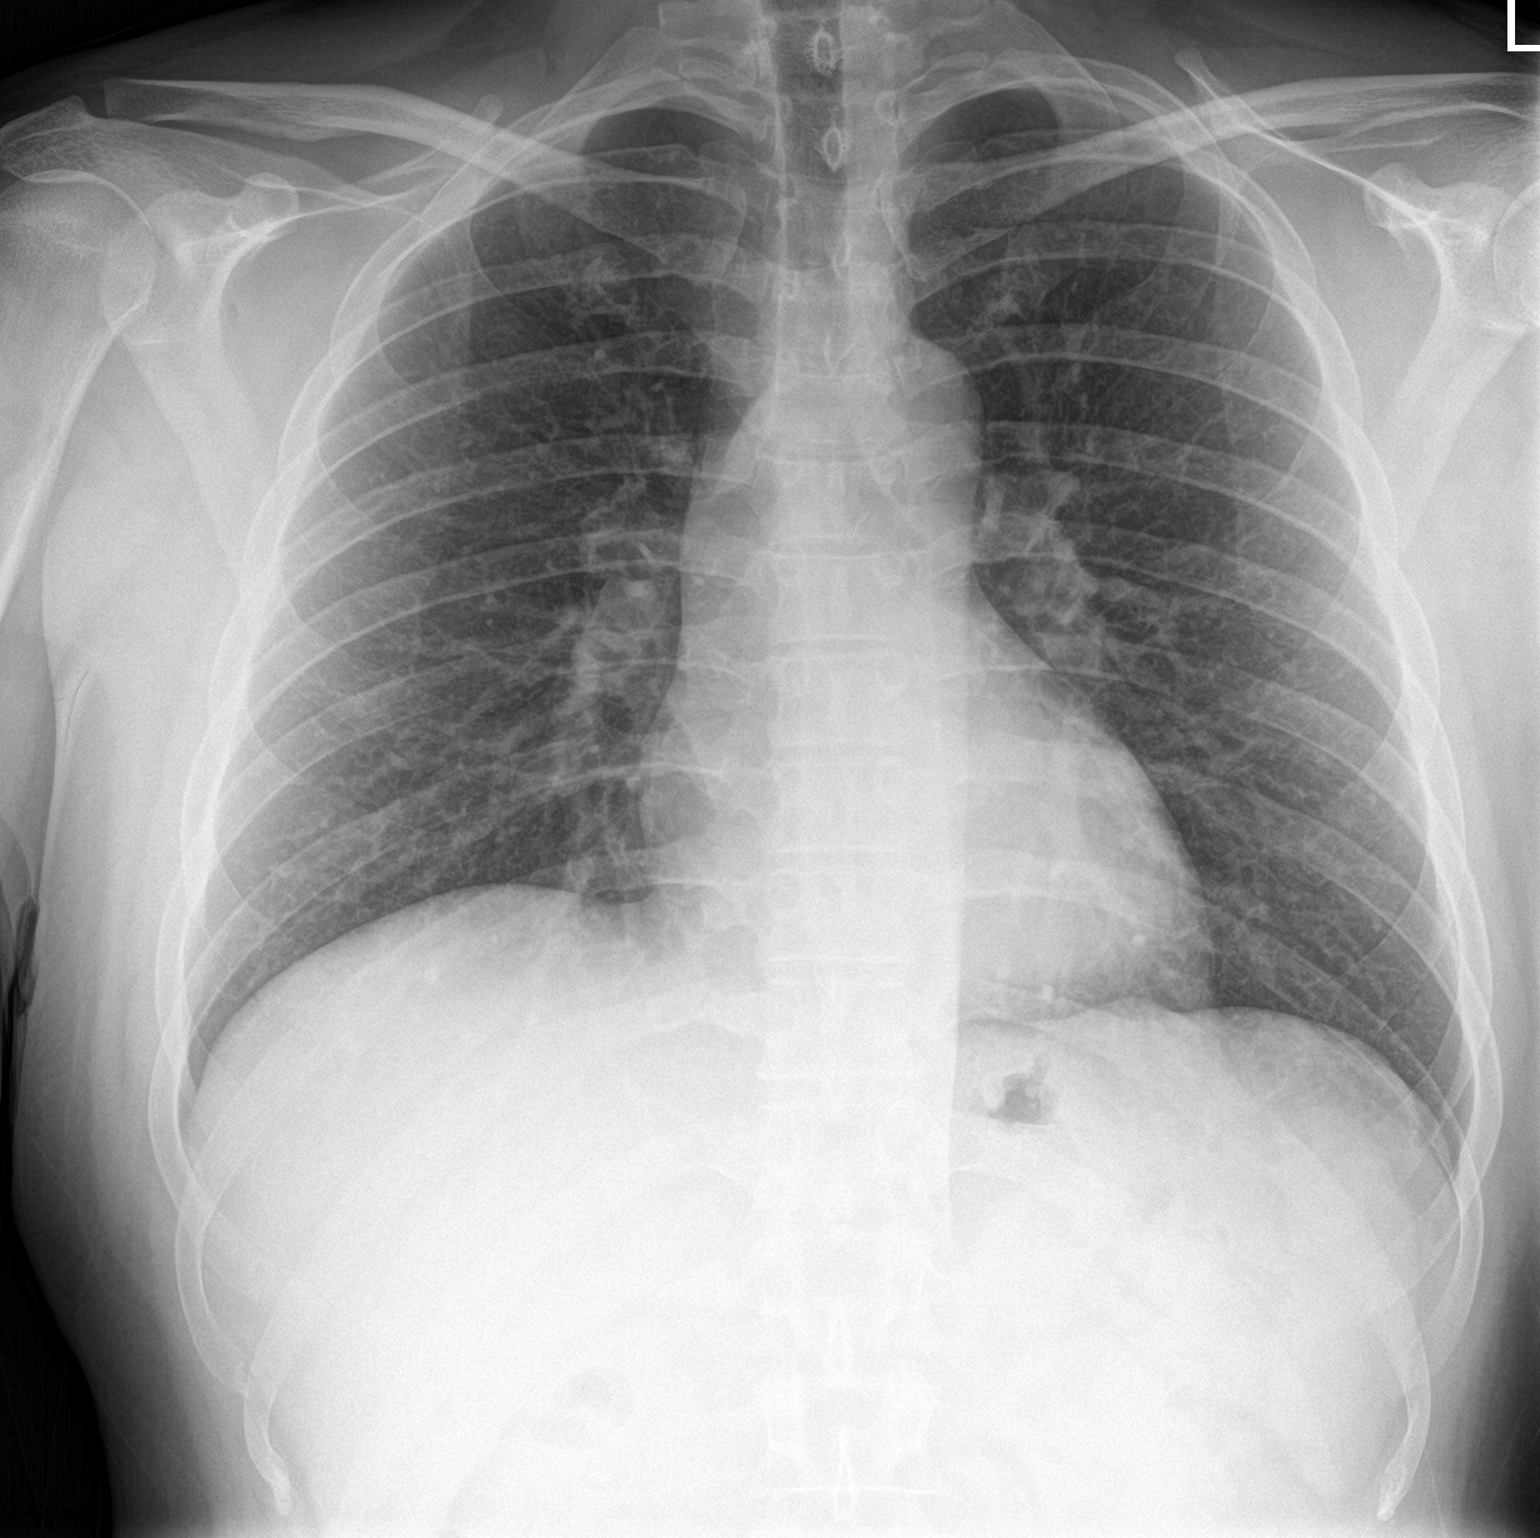

[chest lat]
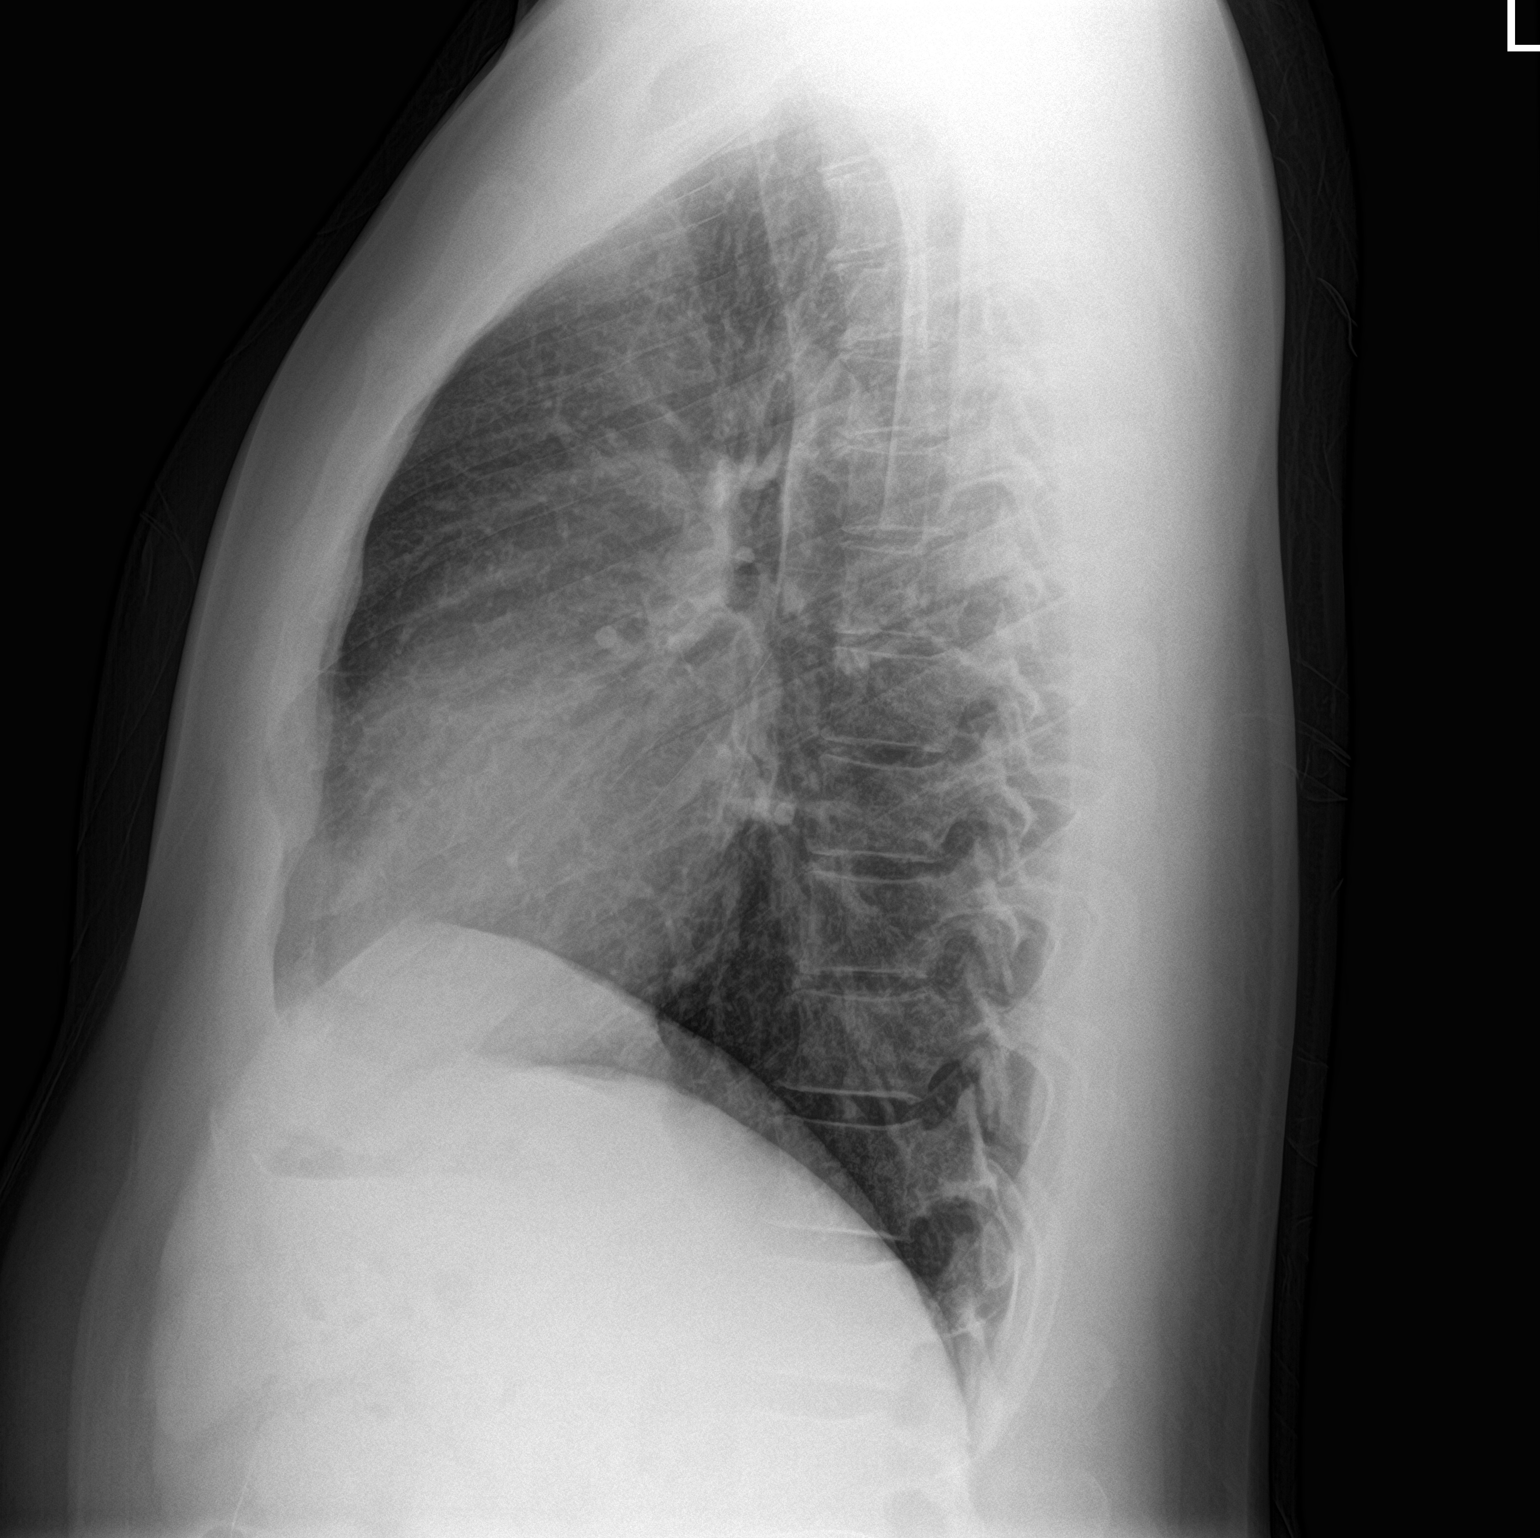

[2 of 2 positions shown; findings below may reference images not displayed]

FINDINGS: Lung volumes and mediastinal contours are within normal limits.
Visualized tracheal air column is within normal limits. No
pneumothorax, pulmonary edema, pleural effusion or confluent
pulmonary opacity. Borderline to mild increased pulmonary
interstitial markings, likely smoking related.

Negative visible osseous structures, bowel gas pattern.
IMPRESSION: Negative aside from borderline to mild increased interstitial
markings which are probably smoking related.

## 2022-04-15 ENCOUNTER — Ambulatory Visit (INDEPENDENT_AMBULATORY_CARE_PROVIDER_SITE_OTHER): Payer: 59 | Admitting: Family Medicine

## 2022-04-15 ENCOUNTER — Encounter: Payer: Self-pay | Admitting: Family Medicine

## 2022-04-15 ENCOUNTER — Ambulatory Visit (INDEPENDENT_AMBULATORY_CARE_PROVIDER_SITE_OTHER): Payer: 59

## 2022-04-15 VITALS — BP 119/93 | HR 71 | Temp 98.1°F | Ht 74.0 in | Wt 243.1 lb

## 2022-04-15 DIAGNOSIS — J069 Acute upper respiratory infection, unspecified: Secondary | ICD-10-CM

## 2022-04-15 DIAGNOSIS — D229 Melanocytic nevi, unspecified: Secondary | ICD-10-CM

## 2022-04-15 DIAGNOSIS — R1012 Left upper quadrant pain: Secondary | ICD-10-CM

## 2022-04-15 NOTE — Progress Notes (Signed)
Acute Office Visit  Subjective:     Patient ID: Dylan Owens, male    DOB: 09-23-1985, 36 y.o.   MRN: [id number]  Chief Complaint  Patient presents with   LLQ pain    Patient in office - c/o  LLQ pain  - symptoms worse yesterday- varying in intensity, coming and going. States feels like "bubbles hit a wall and then move through"  - started 2 days ago in Left lower back and moved to LLQ -pain mostly resolved today just a few twinges. He wanted to mention that he has had cough and congestion  for the past 2 weeks - was home tested for Covid that was negative. His brother -in-law with same symptoms was tested for flu which was negative.     HPI Patient is in today for acute visit. Pt reports 1 week hx of URI symptoms. Daughter had been sick first from daycare. He reports in the last 2 days, he's had LUQ pain started out of the blue. He does report URI symptoms improving. Using mucinex, cough better. No dysuria, no constipation, diarrhea x 1 day yesterday. No nausea or vomiting. He says the pain yesterday was 6/10 but today 0/10. Appetite is good, no sore throat. Eating well. No fevers or chills.  Pt does report mole to right chest near right nipple. Enlarging. First appeared small about 4 years ago. Review of Systems  All other systems reviewed and are negative.       Objective:    BP (!) 119/93   Pulse 71   Temp 98.1 F (36.7 C)   Ht '6\' 2"'$  (1.88 m)   Wt 243 lb 1 oz (110.3 kg)   SpO2 98%   BMI 31.21 kg/m    Physical Exam Vitals and nursing note reviewed.  Constitutional:      Appearance: Normal appearance. He is normal weight.  HENT:     Head: Normocephalic and atraumatic.     Right Ear: Tympanic membrane and external ear normal.     Left Ear: Tympanic membrane, ear canal and external ear normal.     Mouth/Throat:     Mouth: Mucous membranes are moist.  Eyes:     Conjunctiva/sclera: Conjunctivae normal.     Pupils: Pupils are equal, round, and reactive to light.   Cardiovascular:     Rate and Rhythm: Normal rate and regular rhythm.  Pulmonary:     Effort: Pulmonary effort is normal.     Breath sounds: Normal breath sounds.  Abdominal:     General: Abdomen is flat. Bowel sounds are normal. There is no distension.     Tenderness: There is no abdominal tenderness. There is no guarding.  Skin:    General: Skin is warm.     Capillary Refill: Capillary refill takes less than 2 seconds.     Comments: Mole near right nipple  Neurological:     General: No focal deficit present.     Mental Status: He is alert. Mental status is at baseline.  Psychiatric:        Mood and Affect: Mood normal.        Behavior: Behavior normal.        Thought Content: Thought content normal.        Judgment: Judgment normal.    No results found for any visits on 04/15/22.      Assessment & Plan:   Problem List Items Addressed This Visit   None Visit Diagnoses  Acute LUQ pain    -  Primary   Relevant Orders   DG Abd Acute W/Chest   Viral upper respiratory tract infection       Relevant Orders   DG Abd Acute W/Chest   Atypical mole       Relevant Orders   Ambulatory referral to Dermatology       No orders of the defined types were placed in this encounter. LUQ pain better today. With URI symptoms, may be related. No GI red flag symptoms to warrant ct scan. For now continue mucinex prn and send for pain film abdomen and chest, rule out pneumonia with referred pain to left upper quadrant.  To send to dermatology for evaluation of atypical mole.    No follow-ups on file.  Leeanne Rio, MD

## 2023-06-03 ENCOUNTER — Encounter: Payer: Self-pay | Admitting: Family Medicine

## 2023-06-03 ENCOUNTER — Ambulatory Visit: Payer: No Typology Code available for payment source | Admitting: Family Medicine

## 2023-06-03 VITALS — BP 134/92 | HR 71 | Ht 74.0 in | Wt 252.5 lb

## 2023-06-03 DIAGNOSIS — Z23 Encounter for immunization: Secondary | ICD-10-CM | POA: Diagnosis not present

## 2023-06-03 DIAGNOSIS — M62838 Other muscle spasm: Secondary | ICD-10-CM | POA: Diagnosis not present

## 2023-06-03 NOTE — Telephone Encounter (Signed)
Patient states a warm feeling in his left calf on and off for 3 days. He reports some discomfort. Patient scheduled.

## 2023-06-03 NOTE — Progress Notes (Signed)
   Acute Office Visit  Subjective:     Patient ID: Dylan Owens, male    DOB: 1985-07-23, 38 y.o.   MRN: [id number]  Chief Complaint  Patient presents with   Leg Pain    HPI Patient is in today for concerns of left leg being warm. He states for the past few days he has been experiencing a warming sensation that will be present and then go away. He denies any erythema or warmth of the area. Denies any trauma to the area.   Review of Systems  Constitutional:  Negative for chills and fever.  Respiratory:  Negative for cough and shortness of breath.   Cardiovascular:  Negative for chest pain.  Neurological:  Negative for headaches.        Objective:    BP (!) 134/92 (BP Location: Left Arm, Patient Position: Sitting, Cuff Size: Large)   Pulse 71   Ht 6\' 2"  (1.88 m)   Wt 252 lb 8 oz (114.5 kg)   SpO2 100%   BMI 32.42 kg/m    Physical Exam Vitals and nursing note reviewed.  Constitutional:      General: He is not in acute distress.    Appearance: Normal appearance.  HENT:     Head: Normocephalic and atraumatic.     Right Ear: External ear normal.     Left Ear: External ear normal.     Nose: Nose normal.  Eyes:     Conjunctiva/sclera: Conjunctivae normal.  Cardiovascular:     Rate and Rhythm: Normal rate.  Pulmonary:     Effort: Pulmonary effort is normal.  Musculoskeletal:     Comments: No erythema, swelling or tenderness to palpation of left lower extremity  Neurological:     General: No focal deficit present.     Mental Status: He is alert and oriented to person, place, and time.  Psychiatric:        Mood and Affect: Mood normal.        Behavior: Behavior normal.        Thought Content: Thought content normal.        Judgment: Judgment normal.     No results found for any visits on 06/03/23.      Assessment & Plan:   Problem List Items Addressed This Visit       Other   Muscle spasm - Primary   Likely believe this is muscle spasm compressing the  nerve that is causing a 30 second warming sensation. Recommended epsom salt bath, massages, or heating pad. 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 No signs of blood clot in the absence of any calf pain or risk factors for blood clots. No signs of trauma, no cellulitis present.  - if persists for more than 1 month we can think about ordering a nerve conduction study      Other Visit Diagnoses       Encounter for immunization       Relevant Orders   Flu vaccine trivalent PF, 6mos and older(Flulaval,Afluria,Fluarix,Fluzone) (Completed)       No orders of the defined types were placed in this encounter.   No follow-ups on file.  Charlton Amor, DO

## 2023-06-03 NOTE — Patient Instructions (Signed)
Try epsom salt bath or heating pad on your leg to help open up the blood vessels   It is not a blood clot

## 2023-06-03 NOTE — Assessment & Plan Note (Addendum)
Likely believe this is muscle spasm compressing the nerve that is causing a 30 second warming sensation. Recommended epsom salt bath, massages, or heating pad. 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 No signs of blood clot in the absence of any calf pain or risk factors for blood clots. No signs of trauma, no cellulitis present.  - if persists for more than 1 month we can think about ordering a nerve conduction study

## 2024-01-17 ENCOUNTER — Encounter: Payer: Self-pay | Admitting: Sports Medicine
